# Patient Record
Sex: Male | Born: 1945 | Race: White | Hispanic: No | Marital: Married | Smoking: Current some day smoker
Health system: Southern US, Community
[De-identification: ages and names within clinical notes are randomized; demographics above are authoritative.]

## PROBLEM LIST (undated history)

## (undated) DIAGNOSIS — I10 Essential (primary) hypertension: Secondary | ICD-10-CM

## (undated) DIAGNOSIS — E119 Type 2 diabetes mellitus without complications: Secondary | ICD-10-CM

## (undated) HISTORY — PX: OTHER SURGICAL HISTORY: SHX169

## (undated) HISTORY — PX: KNEE SURGERY: SHX244

---

## 2008-09-05 ENCOUNTER — Ambulatory Visit: Payer: Self-pay | Admitting: Diagnostic Radiology

## 2008-09-05 ENCOUNTER — Ambulatory Visit (HOSPITAL_BASED_OUTPATIENT_CLINIC_OR_DEPARTMENT_OTHER): Admission: RE | Admit: 2008-09-05 | Discharge: 2008-09-05 | Payer: Self-pay | Admitting: Family Medicine

## 2009-02-17 IMAGING — CR DG KNEE 1-2V*R*
2 series · 2 of 2 positions shown · non-contrast
Comparison: None

CLINICAL DATA: History given of right knee pain.

RIGHT KNEE - 1-2 VIEW

[t knee ap right]
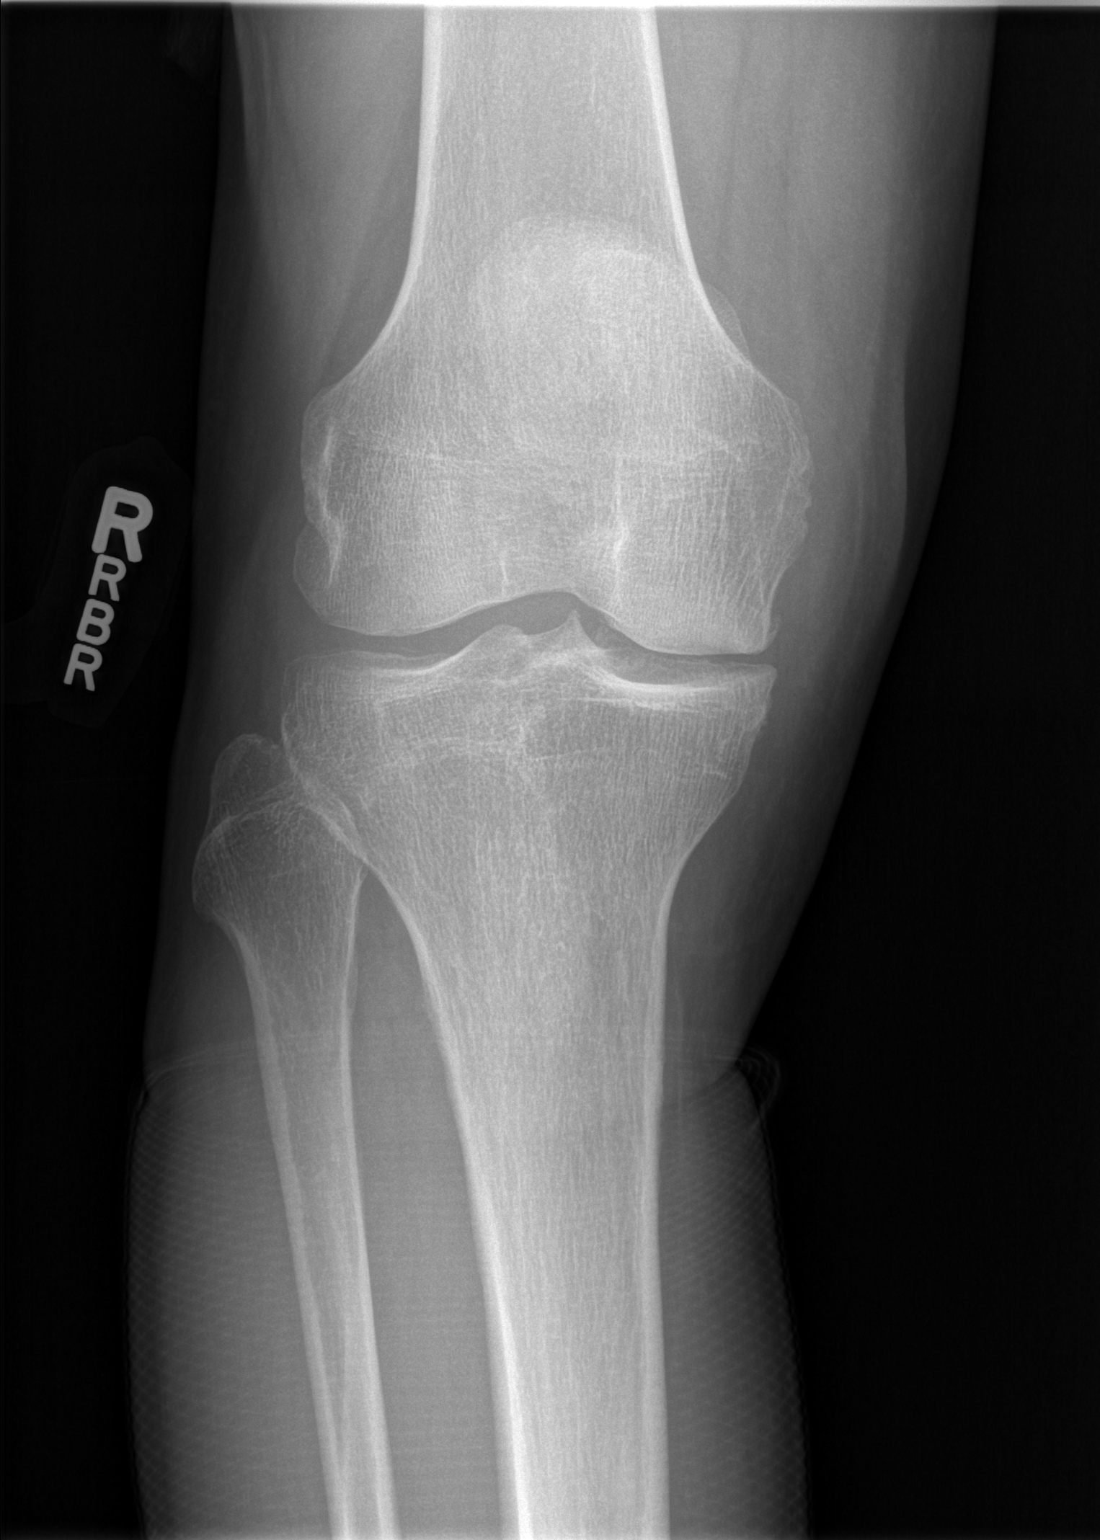

[t knee lat right]
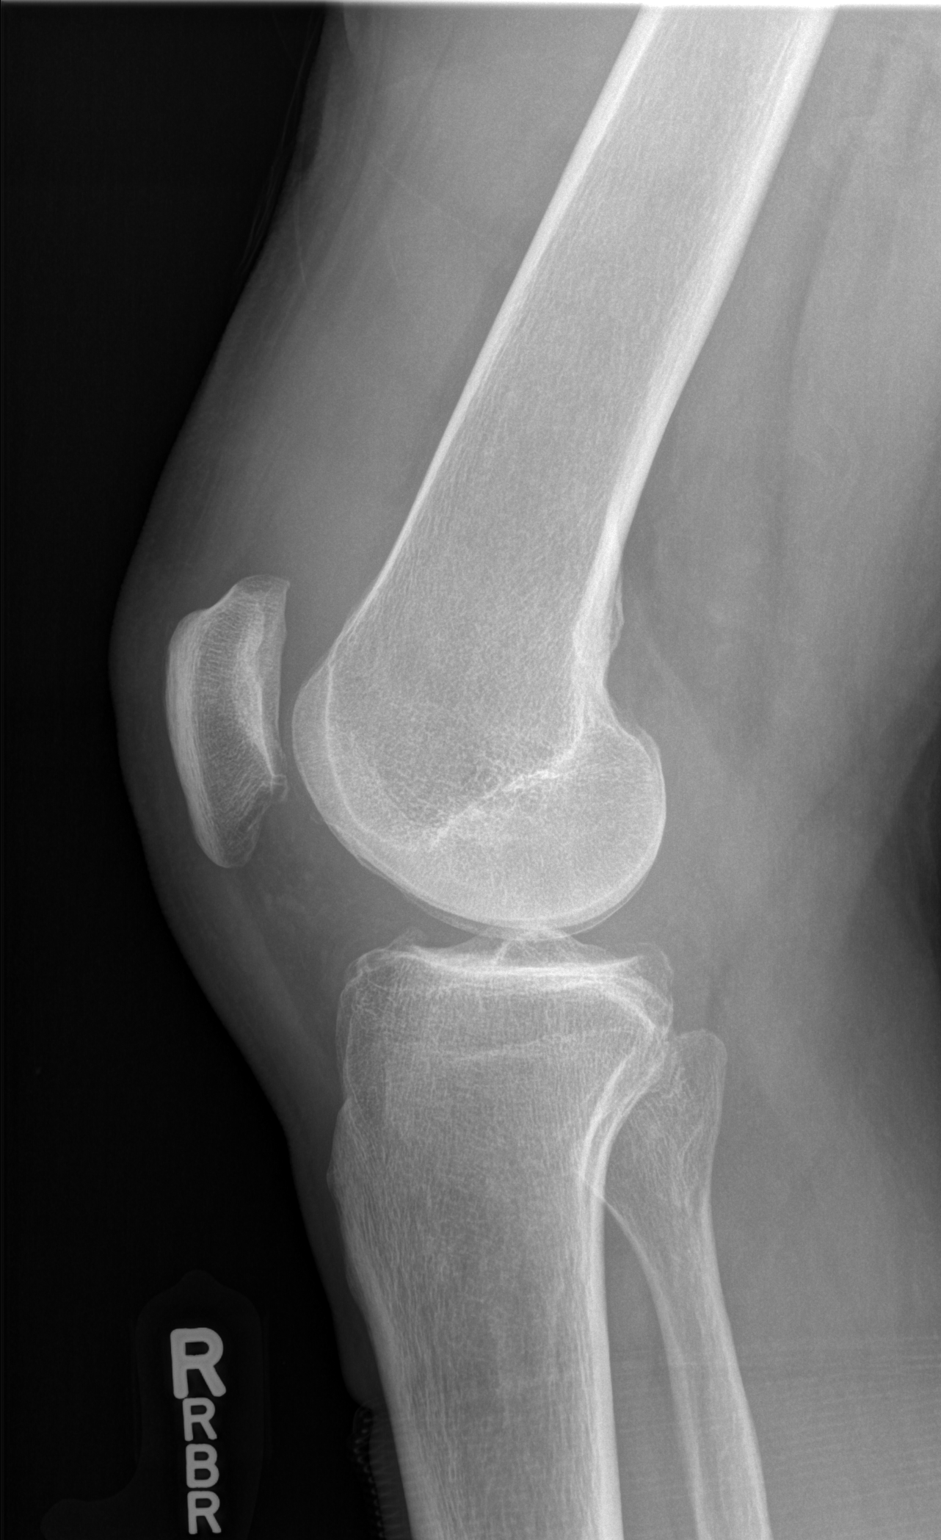

[2 of 2 positions shown; findings below may reference images not displayed]

FINDINGS: There is narrowing of the medial and patellofemoral joint
spaces.  There is marginal osteophyte formation.  There is a joint
effusion in the suprapatellar region.  No fracture or bony
destruction or chondrocalcinosis is seen.
IMPRESSION: Joint effusion is present.  There is evidence of osteoarthritis.

## 2013-04-17 ENCOUNTER — Ambulatory Visit (HOSPITAL_BASED_OUTPATIENT_CLINIC_OR_DEPARTMENT_OTHER)
Admission: RE | Admit: 2013-04-17 | Discharge: 2013-04-17 | Disposition: A | Discharge status: Home / Self Care | Payer: Managed Care, Other (non HMO) | Source: Ambulatory Visit | Attending: Family Medicine | Admitting: Family Medicine

## 2013-04-17 ENCOUNTER — Other Ambulatory Visit (HOSPITAL_BASED_OUTPATIENT_CLINIC_OR_DEPARTMENT_OTHER): Payer: Self-pay | Admitting: Family Medicine

## 2013-04-17 DIAGNOSIS — R609 Edema, unspecified: Secondary | ICD-10-CM

## 2013-04-17 DIAGNOSIS — M7989 Other specified soft tissue disorders: Secondary | ICD-10-CM | POA: Insufficient documentation

## 2013-04-17 DIAGNOSIS — M7120 Synovial cyst of popliteal space [Baker], unspecified knee: Secondary | ICD-10-CM | POA: Insufficient documentation

## 2013-09-29 IMAGING — US US EXTREM LOW VENOUS*L*
1 series · 13 of 24 positions shown · non-contrast
Comparison: Knee MRI 02/22/2013

CLINICAL DATA: Left lower extremity swelling

LEFT LOWER EXTREMITY VENOUS DUPLEX ULTRASOUND
TECHNIQUE: Gray-scale sonography with graded compression, as well
as color Doppler and duplex ultrasound were performed to evaluate
the deep venous system of the lower extremity from the level of the
common femoral vein through the popliteal and proximal calf veins.
Spectral Doppler was utilized to evaluate flow at rest and with
distal augmentation maneuvers.

[Series 1: us extrem low venous*left* · 13 of 37 slices shown]
[im 1/37]
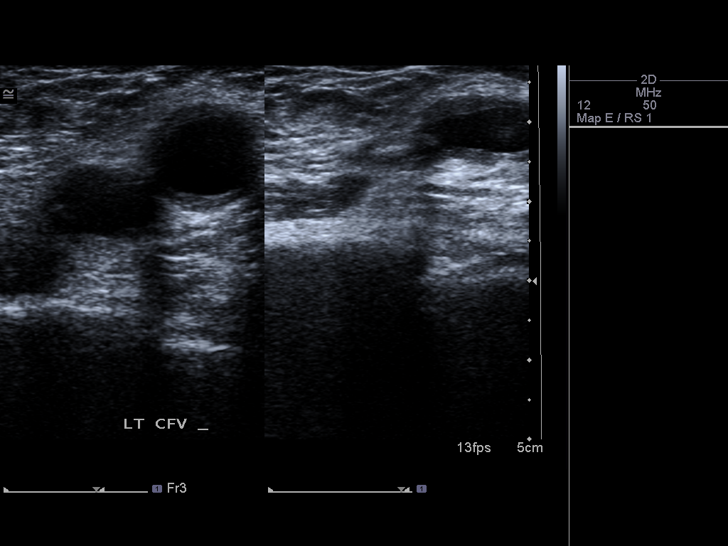
[im 4/37]
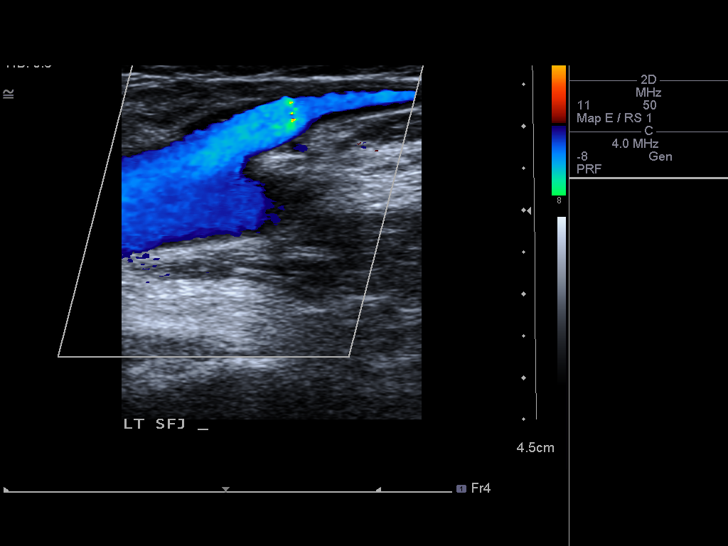
[im 7/37]
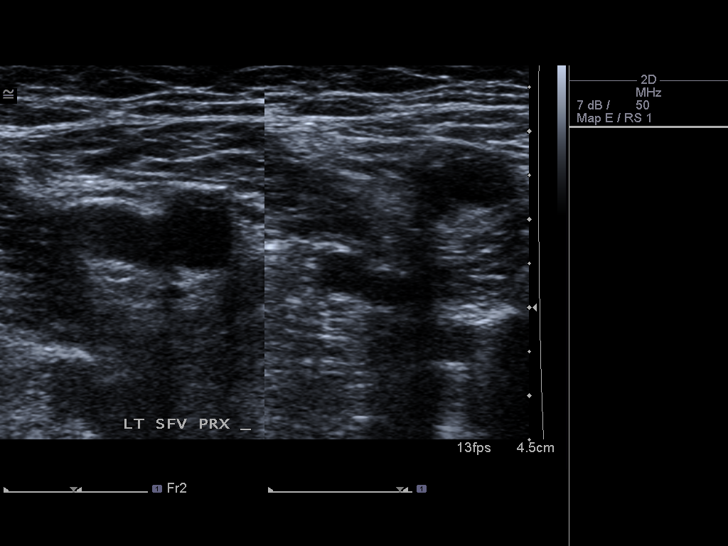
[im 10/37]
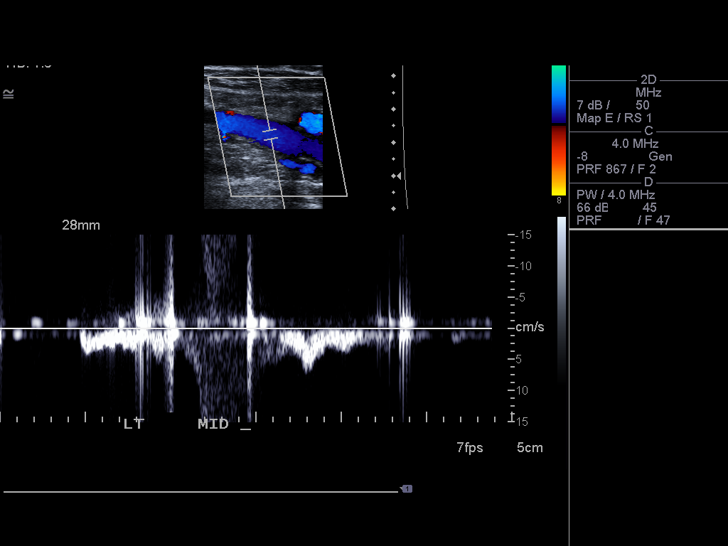
[im 13/37]
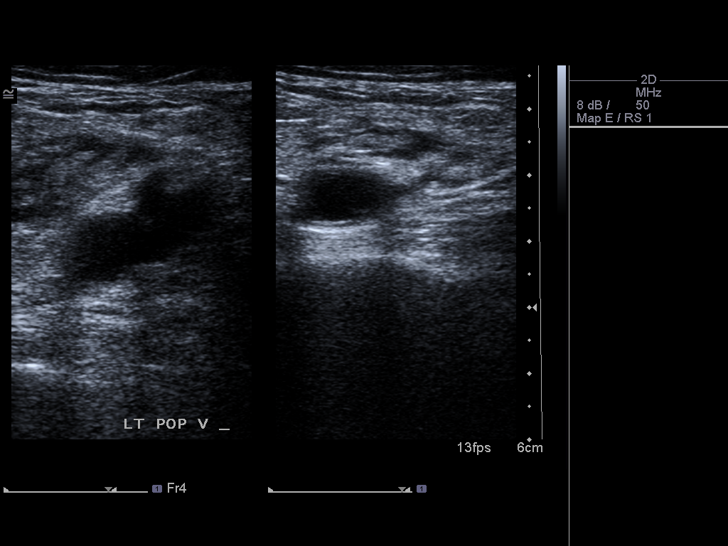
[im 16/37]
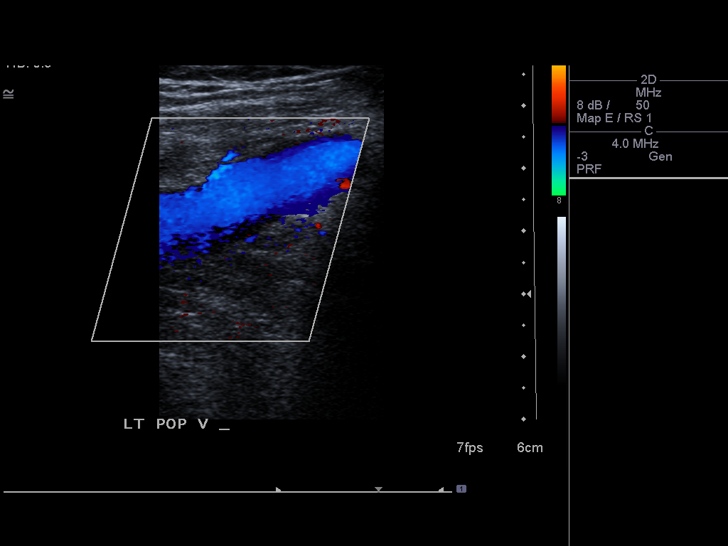
[im 19/37]
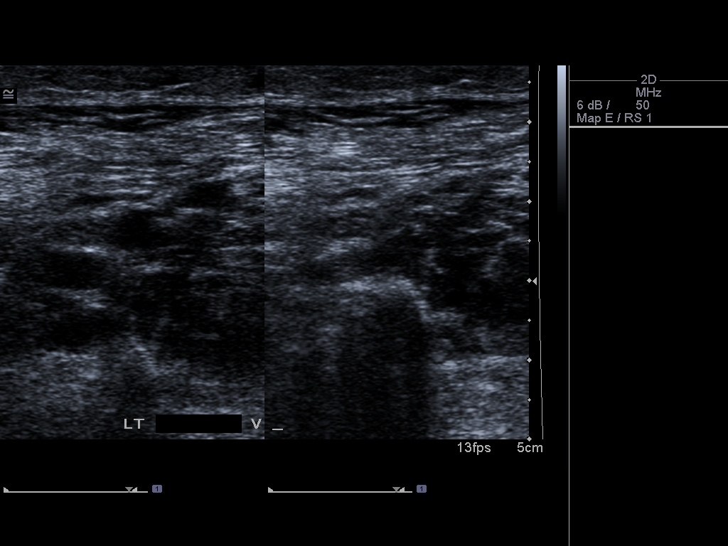
[im 21/37]
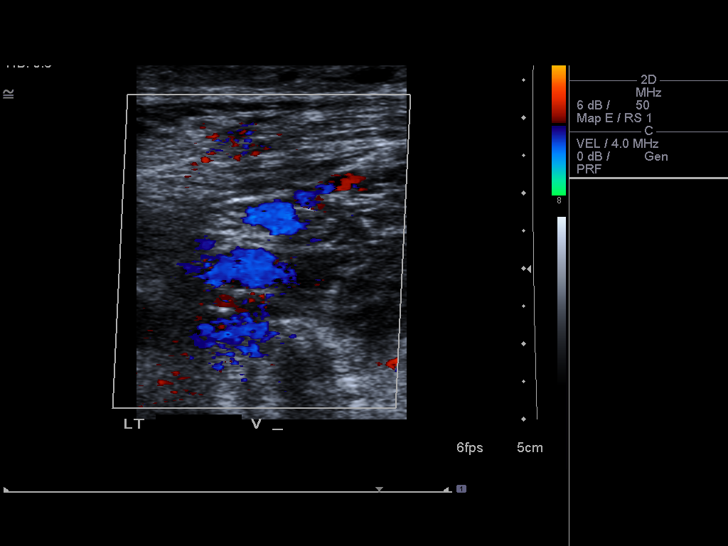
[im 24/37]
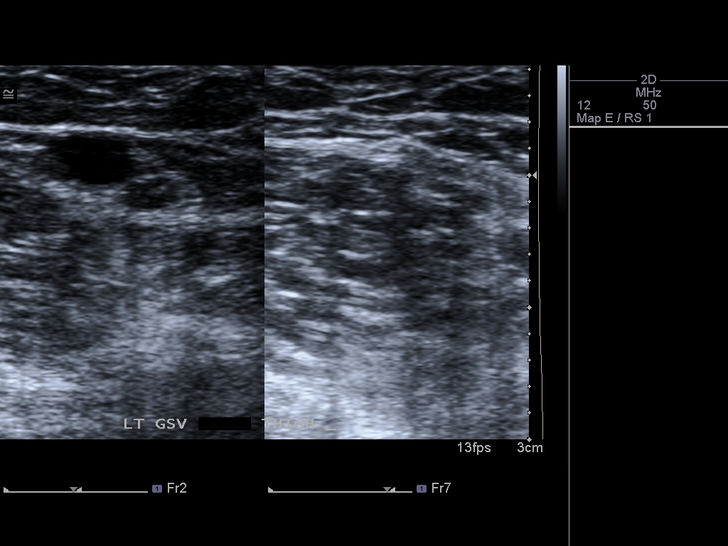
[im 27/37]
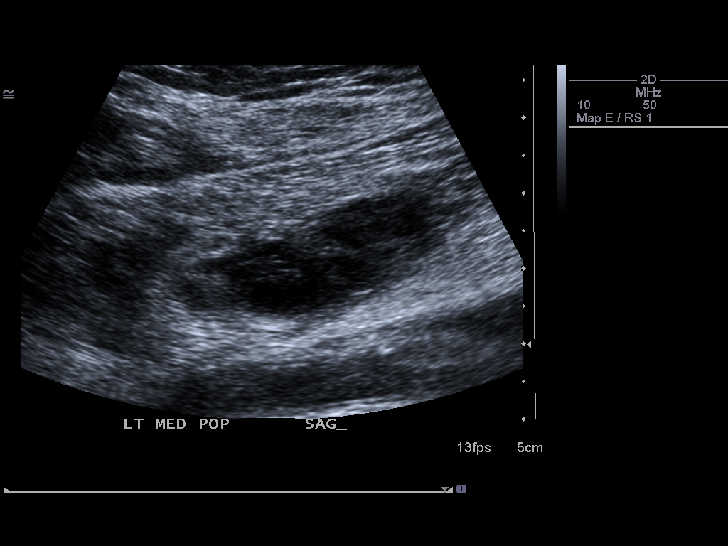
[im 30/37]
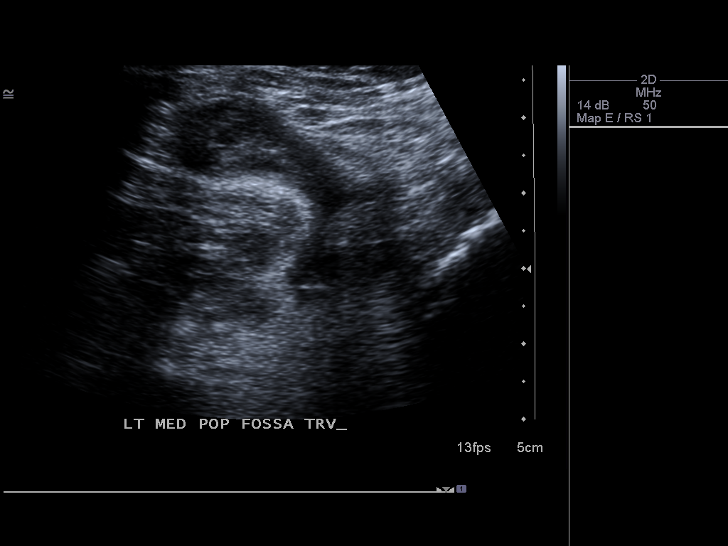
[im 33/37]
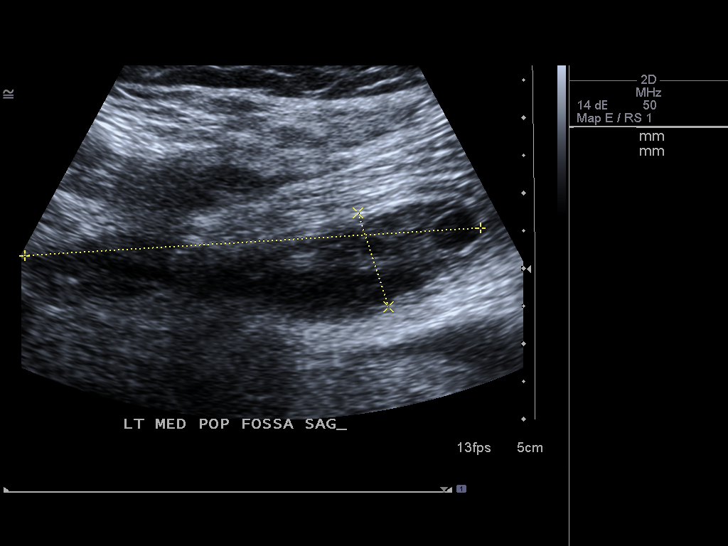
[im 37/37]
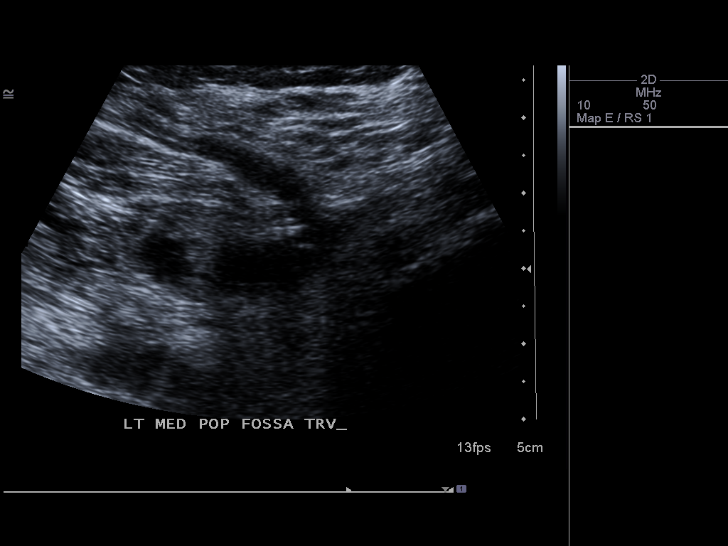

[13 of 24 positions shown; findings below may reference images not displayed]

FINDINGS: Normal compressibility of the common femoral,
superficial femoral, and popliteal veins is demonstrated, as well
as the visualized proximal calf veins.  No filling defects to
suggest DVT on grayscale or color Doppler imaging.  Doppler
waveforms show normal direction of venous flow, normal respiratory
phasicity and response to augmentation. The superficial great
saphenous vein is patent and compressible throughout its visualized
course.

Complex 6.1 x 2.3 x 1.3 cm fluid collection in the medial popliteal
fossa.  This finding is most consistent with a Baker's cyst as seen
on recent MRI.
IMPRESSION: No evidence of lower extremity deep vein thrombosis.

Complex Baker's cyst.

## 2020-12-24 ENCOUNTER — Emergency Department (HOSPITAL_BASED_OUTPATIENT_CLINIC_OR_DEPARTMENT_OTHER): Payer: 59

## 2020-12-24 ENCOUNTER — Encounter (HOSPITAL_BASED_OUTPATIENT_CLINIC_OR_DEPARTMENT_OTHER): Payer: Self-pay | Admitting: *Deleted

## 2020-12-24 ENCOUNTER — Emergency Department (HOSPITAL_BASED_OUTPATIENT_CLINIC_OR_DEPARTMENT_OTHER)
Admission: EM | Admit: 2020-12-24 | Discharge: 2020-12-24 | Disposition: A | Discharge status: Home / Self Care | Payer: 59 | Attending: Emergency Medicine | Admitting: Emergency Medicine

## 2020-12-24 ENCOUNTER — Other Ambulatory Visit: Payer: Self-pay

## 2020-12-24 DIAGNOSIS — K5900 Constipation, unspecified: Secondary | ICD-10-CM | POA: Insufficient documentation

## 2020-12-24 DIAGNOSIS — E119 Type 2 diabetes mellitus without complications: Secondary | ICD-10-CM | POA: Diagnosis not present

## 2020-12-24 DIAGNOSIS — I10 Essential (primary) hypertension: Secondary | ICD-10-CM | POA: Diagnosis not present

## 2020-12-24 DIAGNOSIS — R1031 Right lower quadrant pain: Secondary | ICD-10-CM | POA: Diagnosis present

## 2020-12-24 DIAGNOSIS — F17200 Nicotine dependence, unspecified, uncomplicated: Secondary | ICD-10-CM | POA: Insufficient documentation

## 2020-12-24 HISTORY — DX: Type 2 diabetes mellitus without complications: E11.9

## 2020-12-24 HISTORY — DX: Essential (primary) hypertension: I10

## 2020-12-24 LAB — CBC WITH DIFFERENTIAL/PLATELET
Abs Immature Granulocytes: 0.02 10*3/uL (ref 0.00–0.07)
Basophils Absolute: 0.1 10*3/uL (ref 0.0–0.1)
Basophils Relative: 1 %
Eosinophils Absolute: 0.2 10*3/uL (ref 0.0–0.5)
Eosinophils Relative: 2 %
HCT: 38.2 % — ABNORMAL LOW (ref 39.0–52.0)
Hemoglobin: 13.5 g/dL (ref 13.0–17.0)
Immature Granulocytes: 0 %
Lymphocytes Relative: 26 %
Lymphs Abs: 2.2 10*3/uL (ref 0.7–4.0)
MCH: 31 pg (ref 26.0–34.0)
MCHC: 35.3 g/dL (ref 30.0–36.0)
MCV: 87.6 fL (ref 80.0–100.0)
Monocytes Absolute: 0.7 10*3/uL (ref 0.1–1.0)
Monocytes Relative: 8 %
Neutro Abs: 5.4 10*3/uL (ref 1.7–7.7)
Neutrophils Relative %: 63 %
Platelets: 236 10*3/uL (ref 150–400)
RBC: 4.36 MIL/uL (ref 4.22–5.81)
RDW: 13.3 % (ref 11.5–15.5)
WBC: 8.5 10*3/uL (ref 4.0–10.5)
nRBC: 0 % (ref 0.0–0.2)

## 2020-12-24 LAB — URINALYSIS, ROUTINE W REFLEX MICROSCOPIC
Bilirubin Urine: NEGATIVE
Glucose, UA: NEGATIVE mg/dL
Ketones, ur: NEGATIVE mg/dL
Leukocytes,Ua: NEGATIVE
Nitrite: NEGATIVE
Protein, ur: NEGATIVE mg/dL
Specific Gravity, Urine: 1.015 (ref 1.005–1.030)
pH: 7.5 (ref 5.0–8.0)

## 2020-12-24 LAB — COMPREHENSIVE METABOLIC PANEL
ALT: 12 U/L (ref 0–44)
AST: 16 U/L (ref 15–41)
Albumin: 4 g/dL (ref 3.5–5.0)
Alkaline Phosphatase: 78 U/L (ref 38–126)
Anion gap: 11 (ref 5–15)
BUN: 14 mg/dL (ref 8–23)
CO2: 26 mmol/L (ref 22–32)
Calcium: 9.8 mg/dL (ref 8.9–10.3)
Chloride: 94 mmol/L — ABNORMAL LOW (ref 98–111)
Creatinine, Ser: 0.86 mg/dL (ref 0.61–1.24)
GFR, Estimated: >60 mL/min (ref >60)
Glucose, Bld: 160 mg/dL — ABNORMAL HIGH (ref 70–99)
Potassium: 3.9 mmol/L (ref 3.5–5.1)
Sodium: 131 mmol/L — ABNORMAL LOW (ref 135–145)
Total Bilirubin: 0.9 mg/dL (ref 0.3–1.2)
Total Protein: 7.1 g/dL (ref 6.5–8.1)

## 2020-12-24 LAB — URINALYSIS, MICROSCOPIC (REFLEX): WBC, UA: NONE SEEN WBC/hpf (ref 0–5)

## 2020-12-24 LAB — LIPASE, BLOOD: Lipase: 31 U/L (ref 11–51)

## 2020-12-24 MED ORDER — IOHEXOL 300 MG/ML  SOLN
100.0000 mL | Freq: Once | INTRAMUSCULAR | Status: AC | PRN
  Administered 2020-12-24: 100 mL via INTRAVENOUS

## 2020-12-24 MED ORDER — LIDOCAINE HCL URETHRAL/MUCOSAL 2 % EX GEL
1.0000 "application " | Freq: Once | CUTANEOUS | Status: AC
  Administered 2020-12-24: 1
  Filled 2020-12-24: qty 11

## 2020-12-24 MED ORDER — DICYCLOMINE HCL 10 MG/ML IM SOLN
20.0000 mg | Freq: Once | INTRAMUSCULAR | Status: AC
  Administered 2020-12-24: 20 mg via INTRAMUSCULAR
  Filled 2020-12-24: qty 2

## 2020-12-24 MED ORDER — MAGNESIUM CITRATE PO SOLN: 1.0000 | Freq: Once | ORAL | 0 refills | Status: AC

## 2020-12-24 MED ORDER — SENNOSIDES-DOCUSATE SODIUM 8.6-50 MG PO TABS: 2.0000 | ORAL_TABLET | Freq: Two times a day (BID) | ORAL | 0 refills | Status: AC | PRN

## 2020-12-24 NOTE — ED Notes (Signed)
ED Provider at bedside. 

## 2020-12-24 NOTE — ED Provider Notes (Signed)
[facility] EMERGENCY DEPARTMENT Provider Note   CSN: [id number] Arrival date & time: 12/24/20  1448     History Chief Complaint  Patient presents with  . Constipation    Peter Alexander is a 75 y.o. male.  HPI      75 year old male with history of diabetes, hypertension, presents with concern for abdominal pain and constipation.  Reports a cramping abdominal pain rated 6 out of 10 which is located primarily in the right lower quadrant, as well as rectal pain.  He has not had a bowel movement since this weekend, approximately 5 days ago.  They have tried 1 cap Of MiraLAX without results.  Denies nausea, vomiting, fevers, urinary symptoms.  He was recently started on a new combination blood pressure medication lisinopril hydrochlorothiazide, but denies any other medication changes other than related to vitamins.  Denies any narcotic pain medication use. He used to take senna after his knee surgery but has not tried it now. No hx of abdominal surgeries. No hx of constipation like this.   Past Medical History:  Diagnosis Date  . Diabetes mellitus without complication (HCC)   . Hypertension     There are no problems to display for this patient.   Past Surgical History:  Procedure Laterality Date  . hip    . KNEE SURGERY         No family history on file.  Social History   Tobacco Use  . Smoking status: Current Some Day Smoker  Substance Use Topics  . Alcohol use: Not Currently  . Drug use: Not Currently    Home Medications Prior to Admission medications   Medication Sig Start Date End Date Taking? Authorizing Provider  senna-docusate (SENOKOT-S) 8.6-50 MG tablet Take 2 tablets by mouth 2 (two) times daily as needed for mild constipation or moderate constipation. 12/24/20  Yes Alvira Monday, MD    Allergies    Patient has no known allergies.  Review of Systems   Review of Systems  Constitutional: Negative for fever.  HENT: Negative for sore throat.    Eyes: Negative for visual disturbance.  Respiratory: Negative for shortness of breath.   Cardiovascular: Negative for chest pain.  Gastrointestinal: Positive for abdominal pain and constipation. Negative for diarrhea, nausea and vomiting.  Genitourinary: Negative for difficulty urinating.  Musculoskeletal: Negative for back pain and neck stiffness.  Skin: Negative for rash.  Neurological: Negative for syncope and headaches.    Physical Exam Updated Vital Signs BP (!) 167/110 (BP Location: Left Arm)   Pulse 84   Temp 98.4 F (36.9 C) (Oral)   Resp 16   Ht 5\' 11"  (1.803 m)   Wt 75.3 kg   SpO2 100%   BMI 23.15 kg/m   Physical Exam Vitals and nursing note reviewed.  Constitutional:      General: He is not in acute distress.    Appearance: He is well-developed. He is not diaphoretic.  HENT:     Head: Normocephalic and atraumatic.  Eyes:     Conjunctiva/sclera: Conjunctivae normal.  Cardiovascular:     Rate and Rhythm: Normal rate and regular rhythm.  Pulmonary:     Effort: Pulmonary effort is normal. No respiratory distress.  Abdominal:     General: There is no distension.     Palpations: Abdomen is soft.     Tenderness: There is abdominal tenderness. There is no guarding.  Musculoskeletal:     Cervical back: Normal range of motion.  Skin:    General:  Skin is warm and dry.  Neurological:     Mental Status: He is alert and oriented to person, place, and time.     ED Results / Procedures / Treatments   Labs (all labs ordered are listed, but only abnormal results are displayed) Labs Reviewed  CBC WITH DIFFERENTIAL/PLATELET - Abnormal; Notable for the following components:      Result Value   HCT 38.2 (*)    All other components within normal limits  COMPREHENSIVE METABOLIC PANEL - Abnormal; Notable for the following components:   Sodium 131 (*)    Chloride 94 (*)    Glucose, Bld 160 (*)    All other components within normal limits  URINALYSIS, ROUTINE W REFLEX  MICROSCOPIC - Abnormal; Notable for the following components:   Hgb urine dipstick TRACE (*)    All other components within normal limits  URINALYSIS, MICROSCOPIC (REFLEX) - Abnormal; Notable for the following components:   Bacteria, UA RARE (*)    All other components within normal limits  LIPASE, BLOOD    EKG None  Radiology CT ABDOMEN PELVIS W CONTRAST  Result Date: 12/24/2020 CLINICAL DATA:  Right lower quadrant abdominal pain EXAM: CT ABDOMEN AND PELVIS WITH CONTRAST TECHNIQUE: Multidetector CT imaging of the abdomen and pelvis was performed using the standard protocol following bolus administration of intravenous contrast. CONTRAST:  OMNIPAQUE IOHEXOL 300 MG/ML  SOLN COMPARISON:  None. FINDINGS: Lower chest: Scarring dependently in the lower lobes. Trace right pleural effusion and pericardial effusion. Aortic and coronary artery calcifications. Hepatobiliary: No focal hepatic abnormality. Gallbladder unremarkable. Pancreas: No focal abnormality or ductal dilatation. Spleen: No focal abnormality.  Normal size. Adrenals/Urinary Tract: Bilateral extrarenal pelves. No renal or adrenal mass. No stones or hydronephrosis. Urinary bladder unremarkable. Stomach/Bowel: Large stool burden in the rectosigmoid colon and throughout the colon. Findings concerning for fecal impaction. Normal appendix. Stomach and small bowel decompressed. Vascular/Lymphatic: Diffuse aortoiliac atherosclerosis. No evidence of aneurysm or adenopathy. Reproductive: No visible focal abnormality. Other: No free fluid or free air. Musculoskeletal: No acute bony abnormality. IMPRESSION: Large stool burden in the colon, particularly rectosigmoid colon concerning for fecal impaction. Aortoiliac atherosclerosis. Trace right pleural effusion and pericardial effusion. 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 Electronically Signed   By: Charlett Nose M.D.   On: 12/24/2020 17:33    Procedures Fecal  disimpaction  Date/Time: 12/25/2020 11:55 AM Performed by: Alvira Monday, MD Authorized by: Alvira Monday, MD  Consent: Verbal consent obtained. Risks and benefits: risks, benefits and alternatives were discussed Consent given by: patient (son translating) Required items: required blood products, implants, devices, and special equipment available Patient identity confirmed: verbally with patient Time out: Immediately prior to procedure a "time out" was called to verify the correct patient, procedure, equipment, support staff and site/side marked as required. Patient tolerance: patient tolerated the procedure well with no immediate complications      Medications Ordered in ED Medications  iohexol (OMNIPAQUE) 300 MG/ML solution 100 mL (100 mLs Intravenous Contrast Given 12/24/20 1647)  dicyclomine (BENTYL) injection 20 mg (20 mg Intramuscular Given 12/24/20 1808)  lidocaine (XYLOCAINE) 2 % jelly 1 application (1 application Other Given by Other 12/24/20 1830)    ED Course  I have reviewed the triage vital signs and the nursing notes.  Pertinent labs & imaging results that were available during my care of the patient were reviewed by me and considered in my medical decision making (see chart for details).    MDM Rules/Calculators/A&P  75 year old male with history of diabetes, hypertension, presents with concern for abdominal pain and constipation.  DDx includes appendicitis, pancreatitis, cholecystitis, pyelonephritis, nephrolithiasis, diverticulitis, AAA, constipation.  Given age, abdominal pain and tenderness, CT abdomen/pelvis ordered which shows constipation without other abnormalities.   Disimpaction performed as above.   Discussed more aggressive constipation regimen, gave rx for senokot, recommend higher dosing of miralax.  Final Clinical Impression(s) / ED Diagnoses Final diagnoses:  Constipation, unspecified constipation type  Right lower  quadrant abdominal pain    Rx / DC Orders ED Discharge Orders         Ordered    senna-docusate (SENOKOT-S) 8.6-50 MG tablet  2 times daily PRN        12/24/20 1849    magnesium citrate SOLN   Once        12/24/20 1849           Alvira Monday, MD 12/25/20 1156

## 2020-12-24 NOTE — ED Notes (Signed)
Offered translation services to patient and family. Politely declined. Patient agreeable to use son at bedside for translation.

## 2020-12-24 NOTE — Discharge Instructions (Signed)
Stay hydrated, active and eat a fiber-ful diet.  We recommend more aggressive miralax dosing (4 caps in 1 32oz bottle day 1, 3 caps day 2, 2 caps day 3 then 1 cap daily). If you do not have relief with this aggressive miralax regimen after 3 days, recommend the magnesium citrate.

## 2020-12-24 NOTE — ED Triage Notes (Signed)
Constipation x 1 week with abd pain

## 2021-02-04 ENCOUNTER — Emergency Department (HOSPITAL_BASED_OUTPATIENT_CLINIC_OR_DEPARTMENT_OTHER)
Admission: EM | Admit: 2021-02-04 | Discharge: 2021-02-04 | Disposition: A | Discharge status: Home / Self Care | Payer: 59 | Attending: Emergency Medicine | Admitting: Emergency Medicine

## 2021-02-04 ENCOUNTER — Other Ambulatory Visit: Payer: Self-pay

## 2021-02-04 ENCOUNTER — Encounter (HOSPITAL_BASED_OUTPATIENT_CLINIC_OR_DEPARTMENT_OTHER): Payer: Self-pay | Admitting: *Deleted

## 2021-02-04 DIAGNOSIS — I10 Essential (primary) hypertension: Secondary | ICD-10-CM | POA: Diagnosis not present

## 2021-02-04 DIAGNOSIS — F17200 Nicotine dependence, unspecified, uncomplicated: Secondary | ICD-10-CM | POA: Diagnosis not present

## 2021-02-04 DIAGNOSIS — K5900 Constipation, unspecified: Secondary | ICD-10-CM | POA: Diagnosis present

## 2021-02-04 DIAGNOSIS — K5641 Fecal impaction: Secondary | ICD-10-CM

## 2021-02-04 DIAGNOSIS — E119 Type 2 diabetes mellitus without complications: Secondary | ICD-10-CM | POA: Insufficient documentation

## 2021-02-04 DIAGNOSIS — R109 Unspecified abdominal pain: Secondary | ICD-10-CM

## 2021-02-04 LAB — CBG MONITORING, ED: Glucose-Capillary: 149 mg/dL — ABNORMAL HIGH (ref 70–99)

## 2021-02-04 NOTE — ED Triage Notes (Signed)
C/o constipation no BM x 5 days  with hx of same

## 2021-02-04 NOTE — ED Provider Notes (Signed)
[facility] EMERGENCY DEPARTMENT Provider Note   CSN: [id number] Arrival date & time: 02/04/21  1804     History Chief Complaint  Patient presents with  . Constipation    Peter Alexander is a 75 y.o. male.  The history is provided by the patient, medical records, the spouse and a relative. The history is limited by a language barrier. A language interpreter was used.  Constipation Severity:  Severe Time since last bowel movement:  8 days Timing:  Constant Progression:  Worsening Chronicity:  Recurrent Stool description:  None produced Relieved by:  Nothing Worsened by:  Nothing Ineffective treatments:  Laxatives and Miralax Associated symptoms: abdominal pain   Associated symptoms: no back pain, no diarrhea, no dysuria, no fever, no flatus (is passing gas), no nausea and no vomiting        Past Medical History:  Diagnosis Date  . Diabetes mellitus without complication (HCC)   . Hypertension     There are no problems to display for this patient.   Past Surgical History:  Procedure Laterality Date  . hip    . KNEE SURGERY         No family history on file.  Social History   Tobacco Use  . Smoking status: Current Some Day Smoker  Substance Use Topics  . Alcohol use: Not Currently  . Drug use: Not Currently    Home Medications Prior to Admission medications   Medication Sig Start Date End Date Taking? Authorizing Provider  senna-docusate (SENOKOT-S) 8.6-50 MG tablet Take 2 tablets by mouth 2 (two) times daily as needed for mild constipation or moderate constipation. 12/24/20   Alvira Monday, MD    Allergies    Patient has no known allergies.  Review of Systems   Review of Systems  Constitutional: Negative for chills, fatigue and fever.  HENT: Negative for congestion.   Respiratory: Negative for cough, chest tightness, shortness of breath and wheezing.   Cardiovascular: Negative for chest pain.  Gastrointestinal: Positive for abdominal  pain and constipation. Negative for abdominal distention, diarrhea, flatus (is passing gas), nausea and vomiting.  Genitourinary: Negative for dysuria, flank pain and frequency.  Musculoskeletal: Negative for back pain, neck pain and neck stiffness.  Skin: Negative for wound.  Neurological: Negative for dizziness, light-headedness and headaches.  Psychiatric/Behavioral: Negative for agitation.  All other systems reviewed and are negative.   Physical Exam Updated Vital Signs BP (!) 102/55   Pulse 78   Temp 98.3 F (36.8 C) (Oral)   Resp 16   SpO2 99%   Physical Exam Vitals and nursing note reviewed. Exam conducted with a chaperone present.  Constitutional:      General: He is not in acute distress.    Appearance: He is well-developed. He is not ill-appearing, toxic-appearing or diaphoretic.  HENT:     Head: Normocephalic and atraumatic.  Eyes:     Conjunctiva/sclera: Conjunctivae normal.  Cardiovascular:     Rate and Rhythm: Normal rate and regular rhythm.     Heart sounds: No murmur heard.   Pulmonary:     Effort: Pulmonary effort is normal. No respiratory distress.     Breath sounds: Normal breath sounds. No wheezing, rhonchi or rales.  Chest:     Chest wall: No tenderness.  Abdominal:     General: Abdomen is flat. There is no distension.     Palpations: Abdomen is soft.     Tenderness: There is no abdominal tenderness. There is no right CVA tenderness, left  CVA tenderness, guarding or rebound.  Genitourinary:    Rectum: No anal fissure, external hemorrhoid or internal hemorrhoid. Normal anal tone.     Comments: Fecal impaction on DRE.  Disimpaction took place without difficulty. Musculoskeletal:        General: No tenderness.     Cervical back: Neck supple. No tenderness.     Right lower leg: No edema.     Left lower leg: No edema.  Skin:    General: Skin is warm and dry.     Capillary Refill: Capillary refill takes less than 2 seconds.     Findings: No erythema.   Neurological:     Mental Status: He is alert. Mental status is at baseline.  Psychiatric:        Mood and Affect: Mood normal.     ED Results / Procedures / Treatments   Labs (all labs ordered are listed, but only abnormal results are displayed) Labs Reviewed  CBG MONITORING, ED - Abnormal; Notable for the following components:      Result Value   Glucose-Capillary 149 (*)    All other components within normal limits    EKG None  Radiology No results found.  Procedures Fecal disimpaction  Date/Time: 02/04/2021 7:37 PM Performed by: Heide Scales, MD Authorized by: Heide Scales, MD  Consent: Verbal consent obtained. Risks and benefits: risks, benefits and alternatives were discussed Consent given by: patient Patient understanding: patient states understanding of the procedure being performed Patient identity confirmed: verbally with patient and provided demographic data Local anesthesia used: no  Anesthesia: Local anesthesia used: no  Sedation: Patient sedated: no  Patient tolerance: patient tolerated the procedure well with no immediate complications      Medications Ordered in ED Medications - No data to display  ED Course  I have reviewed the triage vital signs and the nursing notes.  Pertinent labs & imaging results that were available during my care of the patient were reviewed by me and considered in my medical decision making (see chart for details).    MDM Rules/Calculators/A&P                          Peter Alexander is a 75 y.o. male who with a past medical history significant for hypertension, diabetes, Foley catheter use, and recent constipation troubles who presents with constipation.  Patient reports he has not a bowel movement 8 days and had a similar episode last month.  He had a disimpaction after having a reassuring CT scan with only a large stool burden at that time.  He reports no fevers, chills, chest pain, shortness  of breath, nausea, or vomiting.  He denies any rectal bleeding.  He reports he has been trying to use MiraLAX and mag citrate without significant success.  He has not yet seen a gastroenterologist about this.  He denies any urinary changes as he has a catheter in place.  He denies any trauma.  Reports some mild to moderate abdominal discomfort in his lower abdomen with his constipation.  He reports it feels very similar to last month and denies any new symptoms otherwise.  On exam, lungs are clear and chest is nontender.  Abdomen is nontender.  Patient has normal bowel sounds.  No CVA or back tenderness.  No rashes.  Patient otherwise resting.  A [place] video interpreter was utilized for conversations as well as the patient's family initially.  A shared decision made conversation was  held and we elected to do a rectal exam and attempt disimpaction as this is what provided him relief last month.  With his similar symptoms and reassuring CT before with no new component, we agreed to hold on labs and CT scan and instead try the disimpaction initially.  Fecal impaction took place with a lot of success and a large amount of stool was removed manually from his rectum.  Patient a continues to have difficulty, will likely attempt suppository.  If patient still has pain and has no relief of symptoms despite disimpaction and enema, would consider repeat imaging and work-up.  Anticipate reassessment after bowel movement attempt.   8:22 PM Patient was able to have bowel movement after the disimpaction.  He feels much better and would like to go home.  He will continue his MiraLAX regimen and follow-up with a gastroenterologist to continue creating a more long-term bowel management plan for him.  Given his lack of abdominal tenderness and his otherwise reassuring exam, do not feel he needs labs or imaging.  He did get a CBG which was relatively normal at 149.  Patient will be discharged understands return  precautions.  He had other questions or concerns and was discharged in good condition.    Final Clinical Impression(s) / ED Diagnoses Final diagnoses:  Constipation, unspecified constipation type  Fecal impaction in rectum (HCC)  Abdominal discomfort    Rx / DC Orders ED Discharge Orders    None      Clinical Impression: 1. Constipation, unspecified constipation type   2. Fecal impaction in rectum (HCC)   3. Abdominal discomfort     Disposition: Discharge  Condition: Good  I have discussed the results, Dx and Tx plan with the pt(& family if present). He/she/they expressed understanding and agree(s) with the plan. Discharge instructions discussed at great length. Strict return precautions discussed and pt &/or family have verbalized understanding of the instructions. No further questions at time of discharge.    New Prescriptions   No medications on file    Follow Up: [facility] [address] [place] [address] [phone]    Gastroenterology, Deboraha Sprang [address] [place] [address] [place] [address] [phone]     Loyal Jacobson, MD [address] [place] [address] [phone]        Katoria Yetman, Canary Brim, MD 02/04/21 2025

## 2021-02-04 NOTE — ED Notes (Signed)
ED Provider at bedside. 

## 2021-02-04 NOTE — ED Notes (Signed)
Interpreter device placed at bedside per Dr Tegeler's request

## 2021-02-04 NOTE — Discharge Instructions (Signed)
Your history and exam today are consistent with recurrent fecal impaction which were able to manually disimpact here in the emergency department.  As your bowels continue to move, we feel you are now safe for discharge home given the resolution of your abdominal discomfort.  As your presentation was similar and had a reassuring work-up last time, we agreed together to hold on more advanced imaging or labs being repeated.  As her symptoms have resolved we feel this is reasonable.  Please follow-up with your primary doctor as well as a gastroenterologist to discuss a more long-term bowel regimen plan.  Please increase hydration even further and continue to use the MiraLAX.  If symptoms begin again, please consider over-the-counter enema as well.  Please rest and if any symptoms change or worsen and you have worsening discomfort, please return to the nearest emergency department.

## 2021-06-07 IMAGING — CT CT ABD-PELV W/ CM
2 of 5 series · 16 of 46 positions shown, 18 images · IV contrast (Omnipaque)
Comparison: None.

CLINICAL DATA: Right lower quadrant abdominal pain

EXAM:
CT ABDOMEN AND PELVIS WITH CONTRAST
TECHNIQUE: Multidetector CT imaging of the abdomen and pelvis was performed
using the standard protocol following bolus administration of
intravenous contrast.
CONTRAST:  100mL OMNIPAQUE IOHEXOL 300 MG/ML  SOLN

[Series 2: axial st · axial · 0.86mm/px · z∈[-446,+59]mm · 13 of 113 slices shown, 15 images]
[im 6/113  soft-tissue]
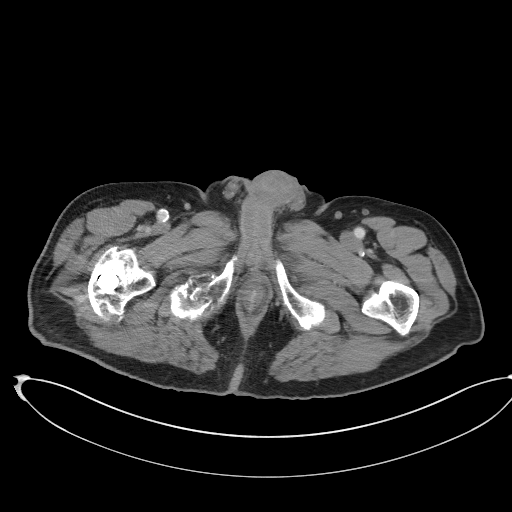
[im 6/113  bone]
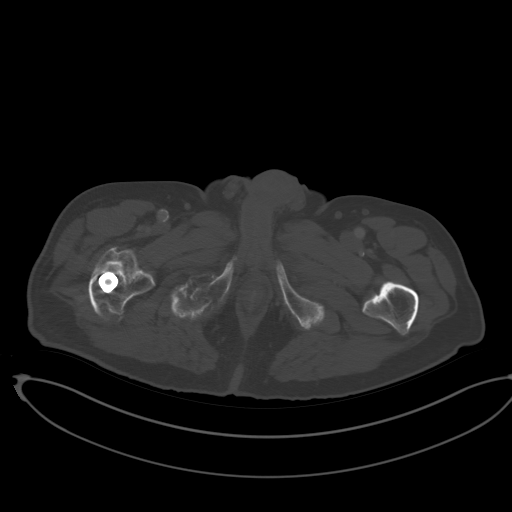
[im 17/113  soft-tissue]
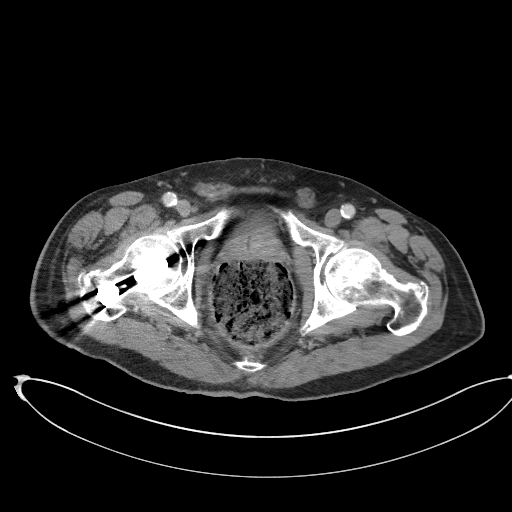
[im 23/113  soft-tissue]
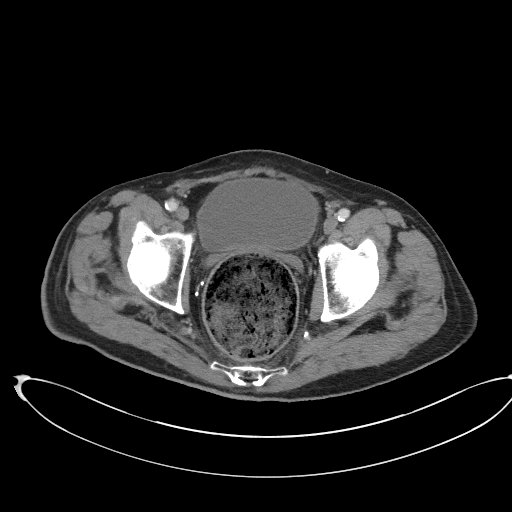
[im 34/113  soft-tissue]
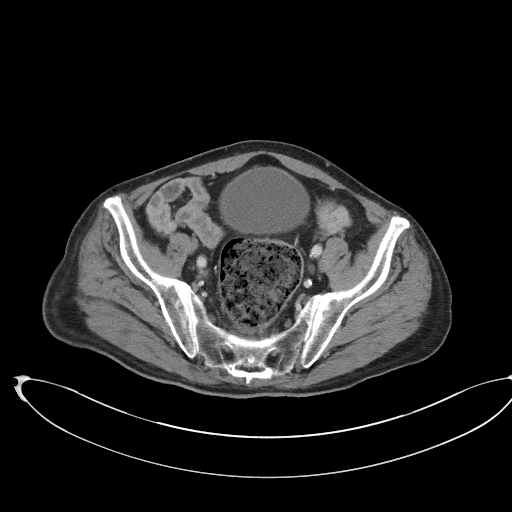
[im 40/113  soft-tissue]
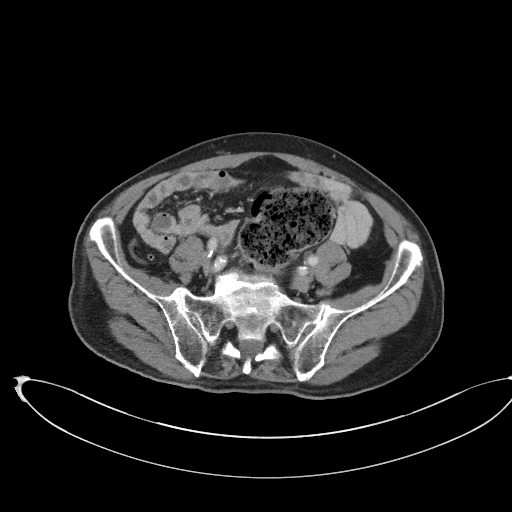
[im 51/113  soft-tissue]
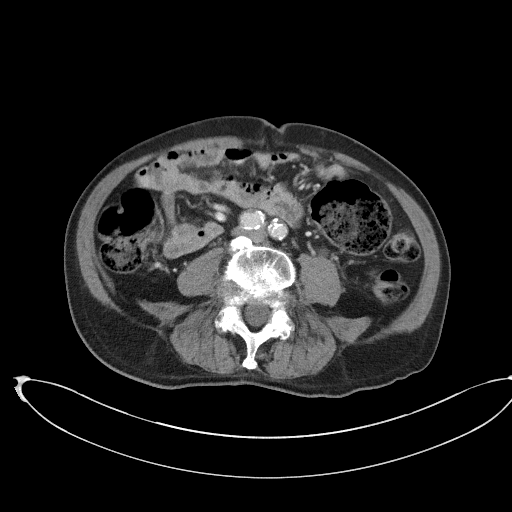
[im 57/113  soft-tissue]
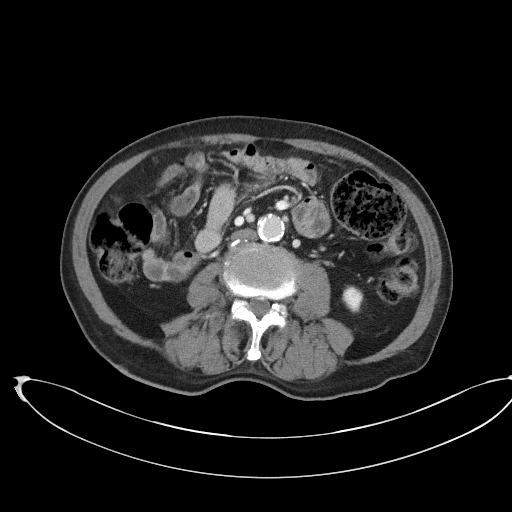
[im 62/113  soft-tissue]
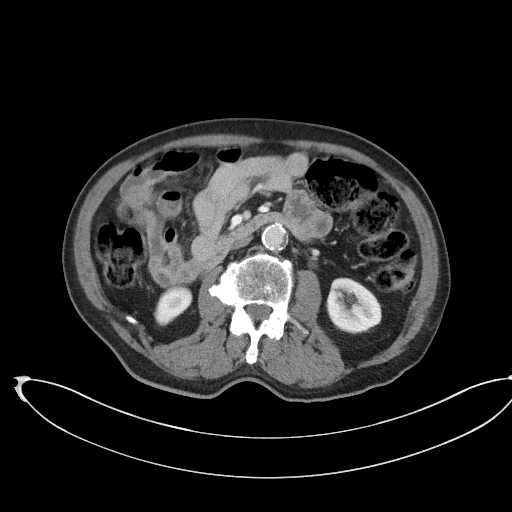
[im 73/113  soft-tissue]
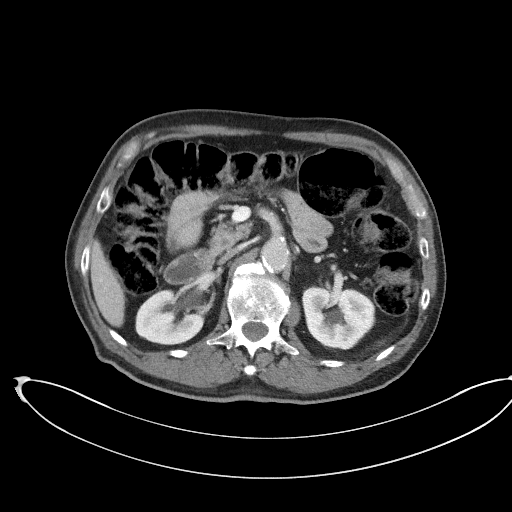
[im 73/113  bone]
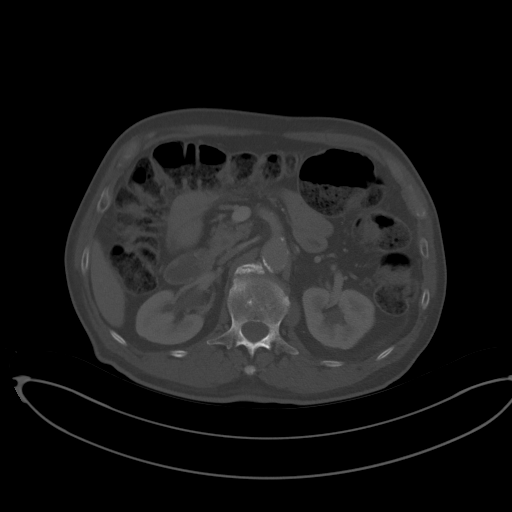
[im 79/113  soft-tissue]
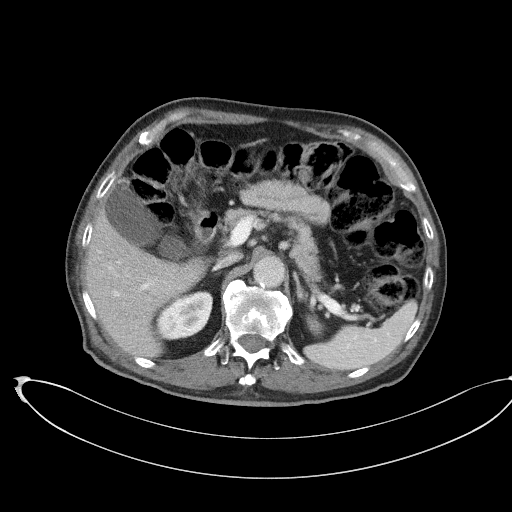
[im 90/113  soft-tissue]
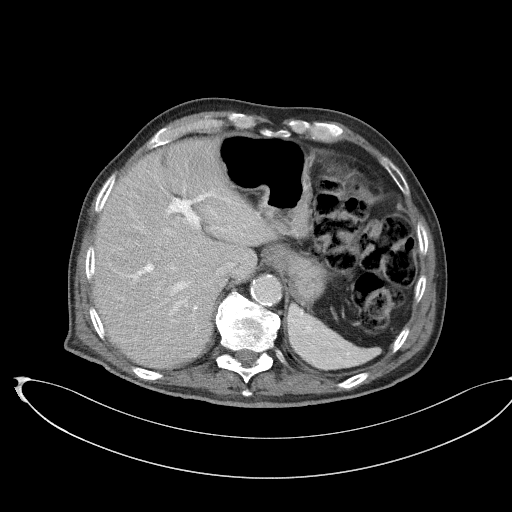
[im 96/113  soft-tissue]
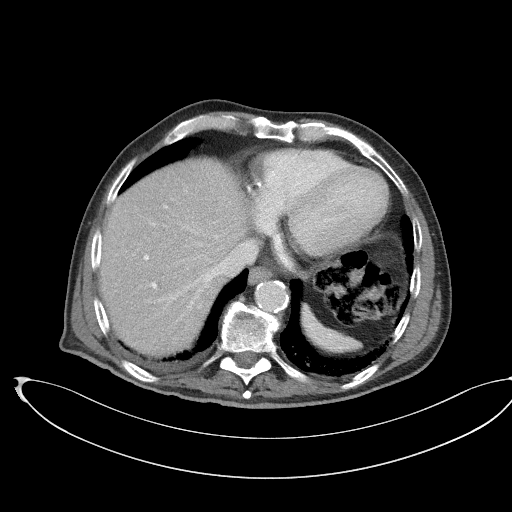
[im 107/113  soft-tissue]
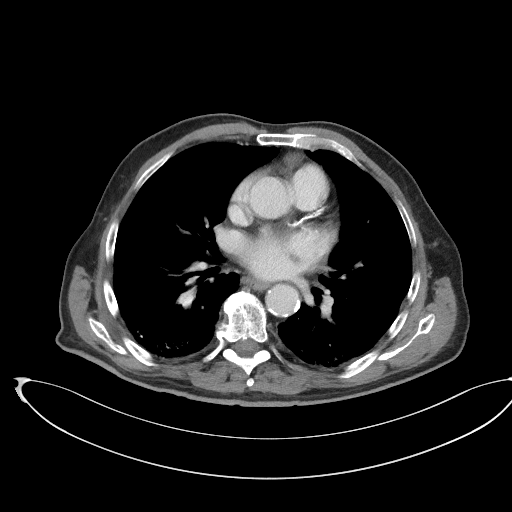

[Series 5: coronal st · coronal · 0.91mm/px · 3 of 99 slices shown]
[im 33/99  soft-tissue]
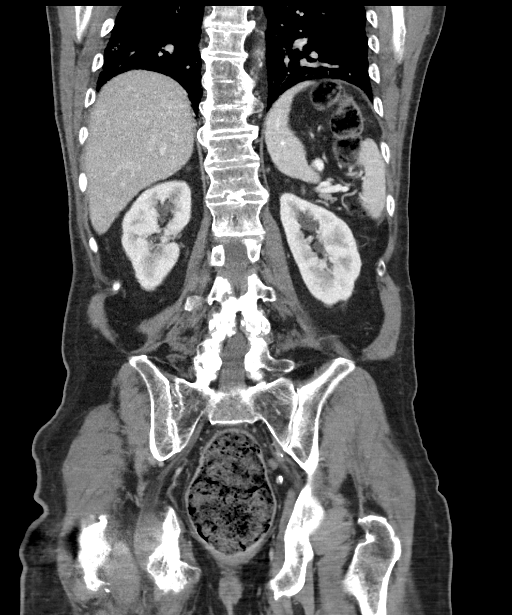
[im 44/99  soft-tissue]
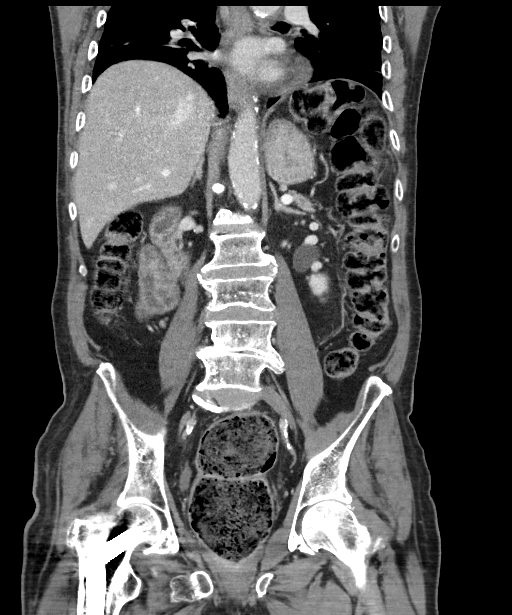
[im 55/99  soft-tissue]
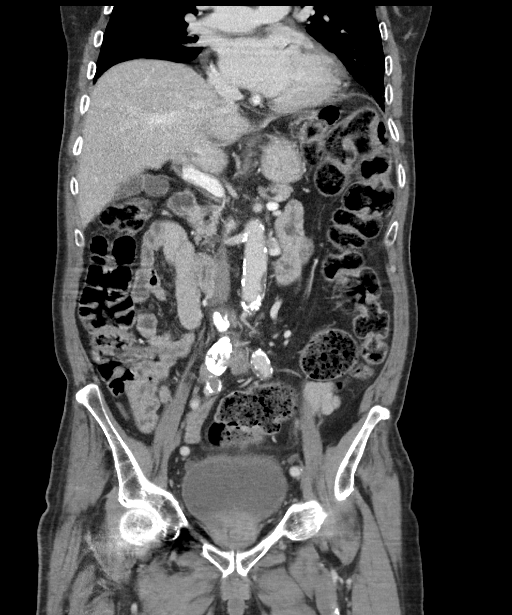

[16 of 46 positions shown; findings below may reference images not displayed]

FINDINGS: Lower chest: Scarring dependently in the lower lobes. Trace right
pleural effusion and pericardial effusion. Aortic and coronary
artery calcifications.

Hepatobiliary: No focal hepatic abnormality. Gallbladder
unremarkable.

Pancreas: No focal abnormality or ductal dilatation.

Spleen: No focal abnormality.  Normal size.

Adrenals/Urinary Tract: Bilateral extrarenal pelves. No renal or
adrenal mass. No stones or hydronephrosis. Urinary bladder
unremarkable.

Stomach/Bowel: Large stool burden in the rectosigmoid colon and
throughout the colon. Findings concerning for fecal impaction.
Normal appendix. Stomach and small bowel decompressed.

Vascular/Lymphatic: Diffuse aortoiliac atherosclerosis. No evidence
of aneurysm or adenopathy.

Reproductive: No visible focal abnormality.

Other: No free fluid or free air.

Musculoskeletal: No acute bony abnormality.
IMPRESSION: Large stool burden in the colon, particularly rectosigmoid colon
concerning for fecal impaction.

Aortoiliac atherosclerosis.

Trace right pleural effusion and pericardial effusion.

Diffuse coronary artery disease and aortic atherosclerosis.

## 2022-10-04 ENCOUNTER — Other Ambulatory Visit: Payer: Self-pay

## 2022-10-04 ENCOUNTER — Emergency Department (HOSPITAL_BASED_OUTPATIENT_CLINIC_OR_DEPARTMENT_OTHER)
Admission: EM | Admit: 2022-10-04 | Discharge: 2022-10-05 | Disposition: A | Discharge status: Home / Self Care | Payer: BC Managed Care – PPO | Attending: Emergency Medicine | Admitting: Emergency Medicine

## 2022-10-04 ENCOUNTER — Encounter (HOSPITAL_BASED_OUTPATIENT_CLINIC_OR_DEPARTMENT_OTHER): Payer: Self-pay | Admitting: *Deleted

## 2022-10-04 DIAGNOSIS — R630 Anorexia: Secondary | ICD-10-CM | POA: Insufficient documentation

## 2022-10-04 DIAGNOSIS — R5383 Other fatigue: Secondary | ICD-10-CM | POA: Diagnosis not present

## 2022-10-04 DIAGNOSIS — Z7984 Long term (current) use of oral hypoglycemic drugs: Secondary | ICD-10-CM | POA: Insufficient documentation

## 2022-10-04 DIAGNOSIS — I10 Essential (primary) hypertension: Secondary | ICD-10-CM | POA: Diagnosis not present

## 2022-10-04 DIAGNOSIS — Z1152 Encounter for screening for COVID-19: Secondary | ICD-10-CM | POA: Diagnosis not present

## 2022-10-04 DIAGNOSIS — E1165 Type 2 diabetes mellitus with hyperglycemia: Secondary | ICD-10-CM | POA: Insufficient documentation

## 2022-10-04 DIAGNOSIS — R739 Hyperglycemia, unspecified: Secondary | ICD-10-CM

## 2022-10-04 HISTORY — DX: Type 2 diabetes mellitus without complications: E11.9

## 2022-10-04 LAB — RESP PANEL BY RT-PCR (RSV, FLU A&B, COVID)  RVPGX2
Influenza A by PCR: NEGATIVE
Influenza B by PCR: NEGATIVE
Resp Syncytial Virus by PCR: NEGATIVE
SARS Coronavirus 2 by RT PCR: NEGATIVE

## 2022-10-04 LAB — CBG MONITORING, ED
Glucose-Capillary: 305 mg/dL — ABNORMAL HIGH (ref 70–99)
Glucose-Capillary: 374 mg/dL — ABNORMAL HIGH (ref 70–99)

## 2022-10-04 LAB — CBC
HCT: 35.5 % — ABNORMAL LOW (ref 39.0–52.0)
Hemoglobin: 12.1 g/dL — ABNORMAL LOW (ref 13.0–17.0)
MCH: 29.9 pg (ref 26.0–34.0)
MCHC: 34.1 g/dL (ref 30.0–36.0)
MCV: 87.7 fL (ref 80.0–100.0)
Platelets: 276 10*3/uL (ref 150–400)
RBC: 4.05 MIL/uL — ABNORMAL LOW (ref 4.22–5.81)
RDW: 12.9 % (ref 11.5–15.5)
WBC: 7.7 10*3/uL (ref 4.0–10.5)
nRBC: 0 % (ref 0.0–0.2)

## 2022-10-04 LAB — BASIC METABOLIC PANEL
Anion gap: 9 (ref 5–15)
BUN: 24 mg/dL — ABNORMAL HIGH (ref 8–23)
CO2: 27 mmol/L (ref 22–32)
Calcium: 9 mg/dL (ref 8.9–10.3)
Chloride: 91 mmol/L — ABNORMAL LOW (ref 98–111)
Creatinine, Ser: 1.24 mg/dL (ref 0.61–1.24)
GFR, Estimated: >60 mL/min (ref >60)
Glucose, Bld: 391 mg/dL — ABNORMAL HIGH (ref 70–99)
Potassium: 4.1 mmol/L (ref 3.5–5.1)
Sodium: 127 mmol/L — ABNORMAL LOW (ref 135–145)

## 2022-10-04 LAB — URINALYSIS, MICROSCOPIC (REFLEX): WBC, UA: NONE SEEN WBC/hpf (ref 0–5)

## 2022-10-04 LAB — URINALYSIS, ROUTINE W REFLEX MICROSCOPIC
Bilirubin Urine: NEGATIVE
Glucose, UA: >=500 mg/dL — AB
Ketones, ur: NEGATIVE mg/dL
Leukocytes,Ua: NEGATIVE
Nitrite: NEGATIVE
Protein, ur: NEGATIVE mg/dL
Specific Gravity, Urine: 1.02 (ref 1.005–1.030)
pH: 7 (ref 5.0–8.0)

## 2022-10-04 MED ORDER — SODIUM CHLORIDE 0.9 % IV BOLUS
20.0000 mL/kg | Freq: Once | INTRAVENOUS | Status: AC
  Administered 2022-10-05: 1500 mL via INTRAVENOUS

## 2022-10-04 MED ORDER — INSULIN ASPART 100 UNIT/ML IJ SOLN
10.0000 [IU] | Freq: Once | INTRAMUSCULAR | Status: AC
  Administered 2022-10-05: 10 [IU] via SUBCUTANEOUS

## 2022-10-04 NOTE — ED Notes (Signed)
Pt has not had his medication for tonight.

## 2022-10-04 NOTE — ED Triage Notes (Addendum)
Pt is brought in by by his son and his wife.  Son reports that pt had CBG 500 yesterday and 300 this am and 400 this afternoon and 500 this pm. Metformin was stopped a month ago and he is still on trulicity, CBG have been elevated for a week, pt has been taking trulicity as prescribed.  Pt has been more tired and thirsty. Pt has had some sweating and sneezing.  Pt MD has boubled his trulicity dose from 8.29 to 1.5 but he has not yest received the increased dose

## 2022-10-04 NOTE — ED Notes (Signed)
Checked CBG 305 RN Elizabeth informed

## 2022-10-04 NOTE — ED Provider Notes (Signed)
[facility] Provider Note: Georgena Spurling, MD, FACEP  CSN: [id number] MRN: [id number] ARRIVAL: 10/04/22 at [place]: [place]  Hyperglycemia   HISTORY OF PRESENT ILLNESS  10/04/22 11:47 PM Peter Alexander is a 77 y.o. male with a history of diabetes.  He was taken off his metformin a month ago due to appetite loss but is still on Trulicity.  He has had an elevated blood sugar for about a week with increased thirst and fatigue.  He has also had some sweating yesterday evening.  His family contacted his PCP who plans to increase his Trulicity dose from 5.10-2.5 but he has not yet started receiving the increased dose.  His sugar was 500 yesterday and 300 this morning.  It was 400 this afternoon and 500 this evening.  On arrival here it was noted to be 374, rechecked at 391.   Past Medical History:  Diagnosis Date   DM (diabetes mellitus) ([place])    Hypertension     Past Surgical History:  Procedure Laterality Date   hip     KNEE SURGERY      History reviewed. No pertinent family history.  Social History   Tobacco Use   Smoking status: Some Days  Substance Use Topics   Alcohol use: Not Currently   Drug use: Not Currently    Prior to Admission medications   Medication Sig Start Date End Date Taking? Authorizing Provider  metFORMIN (GLUCOPHAGE) 1000 MG tablet Take 1 tablet (1,000 mg total) by mouth 2 (two) times daily with a meal. 10/05/22  Yes Tamina Cyphers, MD  senna-docusate (SENOKOT-S) 8.6-50 MG tablet Take 2 tablets by mouth 2 (two) times daily as needed for mild constipation or moderate constipation. 12/24/20   Gareth Morgan, MD    Allergies Patient has no known allergies.   REVIEW OF SYSTEMS  Negative except as noted here or in the History of Present Illness.   PHYSICAL EXAMINATION  Initial Vital Signs Blood pressure (!) 147/89, pulse 76, temperature 98 F (36.7 C), temperature source Oral, resp. rate 15, SpO2 100  %.  Examination General: Well-developed, well-nourished male in no acute distress; appearance consistent with age of record HENT: normocephalic; atraumatic Eyes: Normal appearance Neck: supple Heart: regular rate and rhythm Lungs: clear to auscultation bilaterally Abdomen: soft; nondistended; nontender; bowel sounds present GU: [place] V male, uncircumcised Extremities: No deformity; full range of motion; pulses normal Neurologic: Awake, alert; motor function intact in all extremities and symmetric; no facial droop Skin: Warm and dry Psychiatric: Normal mood and affect   RESULTS  Summary of this visit's results, reviewed and interpreted by myself:   EKG Interpretation  Date/Time:    Ventricular Rate:    PR Interval:    QRS Duration:   QT Interval:    QTC Calculation:   R Axis:     Text Interpretation:         Laboratory Studies: Results for orders placed or performed during the hospital encounter of 10/04/22 (from the past 24 hour(s))  CBG monitoring, ED     Status: Abnormal   Collection Time: 10/04/22  7:52 PM  Result Value Ref Range   Glucose-Capillary 374 (H) 70 - 99 mg/dL  Resp panel by RT-PCR (RSV, Flu A&B, Covid) Anterior Nasal Swab     Status: None   Collection Time: 10/04/22  7:58 PM   Specimen: Anterior Nasal Swab  Result Value Ref Range   SARS Coronavirus 2 by RT PCR NEGATIVE NEGATIVE  Influenza A by PCR NEGATIVE NEGATIVE   Influenza B by PCR NEGATIVE NEGATIVE   Resp Syncytial Virus by PCR NEGATIVE NEGATIVE  Basic metabolic panel     Status: Abnormal   Collection Time: 10/04/22  8:05 PM  Result Value Ref Range   Sodium 127 (L) 135 - 145 mmol/L   Potassium 4.1 3.5 - 5.1 mmol/L   Chloride 91 (L) 98 - 111 mmol/L   CO2 27 22 - 32 mmol/L   Glucose, Bld 391 (H) 70 - 99 mg/dL   BUN 24 (H) 8 - 23 mg/dL   Creatinine, Ser 1.24 0.61 - 1.24 mg/dL   Calcium 9.0 8.9 - 10.3 mg/dL   GFR, Estimated >60 >60 mL/min   Anion gap 9 5 - 15  CBC     Status: Abnormal    Collection Time: 10/04/22  8:05 PM  Result Value Ref Range   WBC 7.7 4.0 - 10.5 K/uL   RBC 4.05 (L) 4.22 - 5.81 MIL/uL   Hemoglobin 12.1 (L) 13.0 - 17.0 g/dL   HCT 35.5 (L) 39.0 - 52.0 %   MCV 87.7 80.0 - 100.0 fL   MCH 29.9 26.0 - 34.0 pg   MCHC 34.1 30.0 - 36.0 g/dL   RDW 12.9 11.5 - 15.5 %   Platelets 276 150 - 400 K/uL   nRBC 0.0 0.0 - 0.2 %  Urinalysis, Routine w reflex microscopic Urine, Clean Catch     Status: Abnormal   Collection Time: 10/04/22  8:33 PM  Result Value Ref Range   Color, Urine YELLOW YELLOW   APPearance CLEAR CLEAR   Specific Gravity, Urine 1.020 1.005 - 1.030   pH 7.0 5.0 - 8.0   Glucose, UA >=500 (A) NEGATIVE mg/dL   Hgb urine dipstick TRACE (A) NEGATIVE   Bilirubin Urine NEGATIVE NEGATIVE   Ketones, ur NEGATIVE NEGATIVE mg/dL   Protein, ur NEGATIVE NEGATIVE mg/dL   Nitrite NEGATIVE NEGATIVE   Leukocytes,Ua NEGATIVE NEGATIVE  Urinalysis, Microscopic (reflex)     Status: Abnormal   Collection Time: 10/04/22  8:33 PM  Result Value Ref Range   RBC / HPF 0-5 0 - 5 RBC/hpf   WBC, UA NONE SEEN 0 - 5 WBC/hpf   Bacteria, UA RARE (A) NONE SEEN   Squamous Epithelial / HPF 0-5 0 - 5 /HPF  CBG monitoring, ED     Status: Abnormal   Collection Time: 10/04/22 11:44 PM  Result Value Ref Range   Glucose-Capillary 305 (H) 70 - 99 mg/dL   Comment 1 Notify RN   CBG monitoring, ED     Status: Abnormal   Collection Time: 10/05/22  1:00 AM  Result Value Ref Range   Glucose-Capillary 245 (H) 70 - 99 mg/dL  CBG monitoring, ED     Status: Abnormal   Collection Time: 10/05/22  1:57 AM  Result Value Ref Range   Glucose-Capillary 204 (H) 70 - 99 mg/dL   Comment 1 Notify RN    Imaging Studies: No results found.  ED COURSE and MDM  Nursing notes, initial and subsequent vitals signs, including pulse oximetry, reviewed and interpreted by myself.  Vitals:   10/05/22 0000 10/05/22 0030 10/05/22 0034 10/05/22 0100  BP: (!) 162/97 (!) 160/81  (!) 151/83  Pulse: 77 71  73   Resp: 15 18  14   Temp:   98.2 F (36.8 C)   TempSrc:   Oral   SpO2: 99% 100%  100%   Medications  sodium chloride 0.9 % bolus  1,500 mL (0 mLs Intravenous Stopped 10/05/22 0057)  insulin aspart (novoLOG) injection 10 Units (10 Units Subcutaneous Given 10/05/22 0009)  insulin aspart (novoLOG) injection 5 Units (5 Units Subcutaneous Given 10/05/22 0143)   The cause of the patient's poor glucose control over the past week is unclear.  He has not been systemically ill with viral type symptoms and his viral panel is negative.  He does not have evidence of urinary tract infection.  We will treat him with insulin and IV fluids as his fatigue and increased thirst are likely due to fluid loss as he has had increased urine output.   1:58 AM Sugar down to 204.  We will restart the patient on metformin pending follow-up with his PCP and evaluation of his new dose of Trulicity.  By reports he was well-controlled when he has taking Trulicity and metformin.   PROCEDURES  Procedures   ED DIAGNOSES     ICD-10-CM   1. Hyperglycemia  R73.9          Kayslee Furey, Jonny Ruiz, MD 10/05/22 0200

## 2022-10-05 DIAGNOSIS — E1165 Type 2 diabetes mellitus with hyperglycemia: Secondary | ICD-10-CM | POA: Diagnosis not present

## 2022-10-05 LAB — CBG MONITORING, ED
Glucose-Capillary: 245 mg/dL — ABNORMAL HIGH (ref 70–99)
Glucose-Capillary: 204 mg/dL — ABNORMAL HIGH (ref 70–99)

## 2022-10-05 MED ORDER — METFORMIN HCL 1000 MG PO TABS: 1000.0000 mg | ORAL_TABLET | Freq: Two times a day (BID) | ORAL | 0 refills | Status: AC

## 2022-10-05 MED ORDER — INSULIN ASPART 100 UNIT/ML IJ SOLN
5.0000 [IU] | Freq: Once | INTRAMUSCULAR | Status: AC
  Administered 2022-10-05: 5 [IU] via SUBCUTANEOUS

## 2022-10-05 NOTE — ED Notes (Signed)
Checked CBG 204 RN Elizabeth informed

## 2022-10-05 NOTE — ED Notes (Signed)
Rx x 1 given  Written and verbal inst to pt and his family  Verbalized an understanding  To home with family

## 2023-01-19 DEATH — deceased
